# Patient Record
Sex: Female | Born: 1980 | Race: Black or African American | Hispanic: No | Smoking: Never smoker
Health system: Southern US, Community
[De-identification: ages and names within clinical notes are randomized; demographics above are authoritative.]

---
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
## 2000-06-10 ENCOUNTER — Encounter (INDEPENDENT_AMBULATORY_CARE_PROVIDER_SITE_OTHER): Payer: Self-pay | Admitting: Specialist

## 2000-06-10 ENCOUNTER — Other Ambulatory Visit: Admission: RE | Admit: 2000-06-10 | Discharge: 2000-06-10 | Payer: Self-pay | Admitting: Obstetrics and Gynecology

## 2000-11-24 ENCOUNTER — Inpatient Hospital Stay (HOSPITAL_COMMUNITY): Admission: AD | Admit: 2000-11-24 | Discharge: 2000-11-24 | Payer: Self-pay | Admitting: Obstetrics and Gynecology

## 2000-11-25 ENCOUNTER — Inpatient Hospital Stay (HOSPITAL_COMMUNITY): Admission: AD | Admit: 2000-11-25 | Discharge: 2000-11-25 | Payer: Self-pay | Admitting: Obstetrics and Gynecology

## 2000-12-04 ENCOUNTER — Other Ambulatory Visit: Admission: RE | Admit: 2000-12-04 | Discharge: 2000-12-04 | Payer: Self-pay | Admitting: Obstetrics and Gynecology

## 2000-12-24 ENCOUNTER — Inpatient Hospital Stay (HOSPITAL_COMMUNITY): Admission: AD | Admit: 2000-12-24 | Discharge: 2000-12-27 | Payer: Self-pay | Admitting: Obstetrics and Gynecology

## 2001-01-22 ENCOUNTER — Other Ambulatory Visit: Admission: RE | Admit: 2001-01-22 | Discharge: 2001-01-22 | Payer: Self-pay | Admitting: Obstetrics and Gynecology

## 2002-06-18 ENCOUNTER — Other Ambulatory Visit: Admission: RE | Admit: 2002-06-18 | Discharge: 2002-06-18 | Payer: Self-pay | Admitting: Obstetrics and Gynecology

## 2002-11-12 ENCOUNTER — Inpatient Hospital Stay (HOSPITAL_COMMUNITY): Admission: AD | Admit: 2002-11-12 | Discharge: 2002-11-12 | Payer: Self-pay | Admitting: Obstetrics and Gynecology

## 2002-11-16 ENCOUNTER — Inpatient Hospital Stay (HOSPITAL_COMMUNITY): Admission: AD | Admit: 2002-11-16 | Discharge: 2002-11-16 | Payer: Self-pay | Admitting: Obstetrics and Gynecology

## 2002-11-20 ENCOUNTER — Inpatient Hospital Stay (HOSPITAL_COMMUNITY): Admission: AD | Admit: 2002-11-20 | Discharge: 2002-11-21 | Payer: Self-pay | Admitting: Obstetrics and Gynecology

## 2004-02-17 ENCOUNTER — Emergency Department (HOSPITAL_COMMUNITY): Admission: EM | Admit: 2004-02-17 | Discharge: 2004-02-17 | Payer: Self-pay | Admitting: Emergency Medicine

## 2004-03-27 ENCOUNTER — Emergency Department (HOSPITAL_COMMUNITY): Admission: EM | Admit: 2004-03-27 | Discharge: 2004-03-27 | Payer: Self-pay | Admitting: Emergency Medicine

## 2004-10-27 ENCOUNTER — Emergency Department (HOSPITAL_COMMUNITY): Admission: EM | Admit: 2004-10-27 | Discharge: 2004-10-27 | Payer: Self-pay | Admitting: Family Medicine

## 2005-02-23 ENCOUNTER — Other Ambulatory Visit: Admission: RE | Admit: 2005-02-23 | Discharge: 2005-02-23 | Payer: Self-pay | Admitting: Obstetrics and Gynecology

## 2006-04-09 ENCOUNTER — Other Ambulatory Visit: Admission: RE | Admit: 2006-04-09 | Discharge: 2006-04-09 | Payer: Self-pay | Admitting: Obstetrics and Gynecology

## 2008-08-27 ENCOUNTER — Ambulatory Visit (HOSPITAL_COMMUNITY): Admission: RE | Admit: 2008-08-27 | Discharge: 2008-08-27 | Payer: Self-pay | Admitting: Obstetrics and Gynecology

## 2010-02-23 ENCOUNTER — Encounter: Admission: RE | Admit: 2010-02-23 | Discharge: 2010-02-23 | Payer: Self-pay | Admitting: Internal Medicine

## 2010-03-23 ENCOUNTER — Emergency Department (HOSPITAL_BASED_OUTPATIENT_CLINIC_OR_DEPARTMENT_OTHER): Admission: EM | Admit: 2010-03-23 | Discharge: 2010-03-23 | Payer: Self-pay | Admitting: Emergency Medicine

## 2010-12-22 NOTE — H&P (Signed)
NAME:  Janice Hall, Janice Hall                        ACCOUNT NO.:  [id number]   MEDICAL RECORD NO.:  [id number]                   PATIENT TYPE:  INP   LOCATION:  9165                                 FACILITY:  [facility]   PHYSICIAN:  Crist Fat. Rivard, M.D.              DATE OF BIRTH:  10-Dec-1980   DATE OF ADMISSION:  11/20/2002  DATE OF DISCHARGE:                                HISTORY & PHYSICAL   HISTORY OF PRESENT ILLNESS:  The patient is a 30 year old single black  female, gravida 4, para 2-0-1-2, at 38-3/7 weeks who presents complaining of  uterine contractions every three to five minutes for the last couple of  hours.  She denies any leaking or vaginal bleeding.  She reports positive  fetal movement.  She reports that she was 3 to 4 cm in the office earlier  today.  She denies any nausea, vomiting, headaches, or visual disturbances.  Her pregnancy has been followed at [facility] by the M.D.  service, and has been essentially uncomplicated, though she has been at risk  for presenting late for care and a history of preterm labor with her first  pregnancy.  Group B Strep is negative.   PAST OB/GYN HISTORY:  1. She is a gravida 4, para 2-0-1-2, delivered a viable female infant in     2000, that weighed 6 pounds 13 ounces at [redacted] weeks gestation following a     10 hour labor.  The infant's name is Mya.  2. In 2002, she delivered a viable female infant that weighed 6 pounds 12     ounces at [redacted] weeks gestation following a five hour labor.  She did have     an epidural with that labor.  That infant's name is Apolinar Junes.  3. Later in 2002, she had an elective AB with no complications.  4. She has this current pregnancy.  Other GYN history is noncontributory.   ALLERGIES:  No known drug allergies.   PAST MEDICAL HISTORY:  She reports having had the usual childhood diseases.  She has no medical problems.   PAST SURGICAL HISTORY:  Her only hospitalization has been for childbirth.   FAMILY HISTORY:  Significant only for a paternal uncle with myocardial  infarction and heart disease.   GENETIC HISTORY:  Negative.   SOCIAL HISTORY:  She is single.  The father of the baby is Caleb Popp.  He is supportive.  She is of [place] faith.  She denies any illicit drug  use, alcohol, or smoking with this pregnancy.   LABORATORY DATA:  Blood type is O positive, antibody screen is negative.  Sickle cell trait is negative.  Syphilis is nonreactive.  Rubella is immune.  Hepatitis B surface antigen is negative.  HIV is nonreactive.  Cystic  fibrosis is negative.  GC and Chlamydia were negative.  Pap was within  normal limits.  Her one hour Glucola was 64.  Her maternal serum alpha  fetoprotein was within normal range.  She had an ultrasound at [redacted] weeks  gestation showing appropriate growth at 25th to 50th percentile, and normal  fluid.  Her Group B Strep at 36 weeks was negative, as well as her gonorrhea  and Chlamydia.   PHYSICAL EXAMINATION:  VITAL SIGNS:  Stable.  She is afebrile.  HEENT:  Grossly within normal limits.  HEART:  Regular rate and rhythm.  CHEST:  Clear.  BREASTS:  Soft and nontender.  ABDOMEN:  Gravid with uterine contractions every three to four minutes.  Fetal heart rate is reactive and reassuring.  PELVIC:  Her cervix is 6 cm, 90%, vertex, -1 with intact membranes.  EXTREMITIES:  Within normal limits.   ASSESSMENT:  1. Intrauterine pregnancy at term.  2. Active labor.  3. Negative Group B Strep.   PLAN:  Admit to labor and delivery, follow routine M.D. orders, and Dr.  Estanislado Pandy has been notified of the patient's admission and will follow the  patient.      Concha Pyo. Duplantis, C.N.M.              Crist Fat Rivard, M.D.    SJD/MEDQ  D:  11/20/2002  T:  11/20/2002  Job:  [id number]

## 2010-12-22 NOTE — H&P (Signed)
[facility]  Patient:    Janice Hall, Janice Hall                     MRN: [id number] Adm. Date:  [id number] Disc. Date: [id number] Attending:  Silverio Lay A                         History and Physical  REASON FOR ADMISSION:         Intrauterine pregnancy at 36 weeks and 3 days and spontaneous labor.  HISTORY OF PRESENT ILLNESS:   This is a 30 year old single African-American woman, gravida 2, para 1, with an estimated due date by ultrasound of January 18, 2001, being admitted at 36 weeks and 4 days for spontaneous labor. She started feeling regular uterine contractions every five minutes about 8 p.m. on Dec 24, 2000.  She arrived at [facility] at 11 oclock with contractions every 2 to 3 minutes and a vaginal exam of 4 to 5 cm, 80% effaced, vertex -1.  At 11:48 p.m., she spontaneously ruptured her membranes with clear fluid.  She reports good fetal activity, denies any symptoms of pregnancy-induced hypertension and fetal heart rate tracings have remained reactive.  Prenatal course reveals blood type O-positive, sickle cell trait negative, RPR nonreactive, rubella immune, HBsAg negative, HIV nonreactive, Pap smear normal, gonorrhea negative, Chlamydia negative, ultrasound at 20 weeks revealed a posterior partial previa with a normal anatomy survey otherwise, AFP was too late for interpretation, 24-week ultrasound revealed a normal posterior placenta and estimated fetal weight in the 37th percentile, 28-week glucose tolerance test was within normal limits and 35-week group B strep was positive.  Prenatal course was otherwise uneventful.  ALLERGIES:                    No known drug allergies.  PAST MEDICAL HISTORY:         September 2000, spontaneous vaginal delivery of a little girl at 40 weeks weighing 6 pounds 12 ounces, no complications. History of CIN-3, November 2001, pending treatment.  FAMILY HISTORY:               Unremarkable.  SOCIAL  HISTORY:               Single, nonsmoker, supportive father of the baby.  Patient is a Consulting civil engineer.  PHYSICAL EXAMINATION:  VITAL SIGNS:                  Normal.  HEENT:                        Negative.  LUNGS:                        Clear.  HEART:                        Normal.  ABDOMEN:                      Gravid, nontender.  PELVIC:                       Vaginal exam upon admission by admitting nurse, 4 to 5 cm, 80% effaced, vertex -1 station.  EXTREMITIES:                  Negative.  FETAL HEART RATE TRACING:  Reassuring.  ASSESSMENT:                   Intrauterine pregnancy at 36 weeks and 4 days, in spontaneous labor with group B streptococcus positive.  PLAN:                         Start penicillin per protocol, spontaneous vaginal delivery expected, may have epidural as needed. DD:  12/25/00 TD:  12/25/00 Job: [id number]

## 2011-01-29 ENCOUNTER — Emergency Department (HOSPITAL_BASED_OUTPATIENT_CLINIC_OR_DEPARTMENT_OTHER)
Admission: EM | Admit: 2011-01-29 | Discharge: 2011-01-29 | Disposition: A | Discharge status: Home / Self Care | Payer: BC Managed Care – PPO | Attending: Emergency Medicine | Admitting: Emergency Medicine

## 2011-01-29 DIAGNOSIS — J329 Chronic sinusitis, unspecified: Secondary | ICD-10-CM | POA: Insufficient documentation

## 2011-02-08 ENCOUNTER — Emergency Department (HOSPITAL_BASED_OUTPATIENT_CLINIC_OR_DEPARTMENT_OTHER)
Admission: EM | Admit: 2011-02-08 | Discharge: 2011-02-08 | Disposition: A | Discharge status: Home / Self Care | Payer: BC Managed Care – PPO | Attending: Emergency Medicine | Admitting: Emergency Medicine

## 2011-02-08 DIAGNOSIS — J029 Acute pharyngitis, unspecified: Secondary | ICD-10-CM | POA: Insufficient documentation

## 2014-04-10 ENCOUNTER — Encounter (HOSPITAL_BASED_OUTPATIENT_CLINIC_OR_DEPARTMENT_OTHER): Payer: Self-pay | Admitting: Emergency Medicine

## 2014-04-10 ENCOUNTER — Emergency Department (HOSPITAL_BASED_OUTPATIENT_CLINIC_OR_DEPARTMENT_OTHER): Payer: BC Managed Care – PPO

## 2014-04-10 ENCOUNTER — Emergency Department (HOSPITAL_BASED_OUTPATIENT_CLINIC_OR_DEPARTMENT_OTHER)
Admission: EM | Admit: 2014-04-10 | Discharge: 2014-04-10 | Disposition: A | Discharge status: Home / Self Care | Payer: BC Managed Care – PPO | Attending: Emergency Medicine | Admitting: Emergency Medicine

## 2014-04-10 DIAGNOSIS — S0190XA Unspecified open wound of unspecified part of head, initial encounter: Secondary | ICD-10-CM | POA: Insufficient documentation

## 2014-04-10 DIAGNOSIS — S0003XA Contusion of scalp, initial encounter: Secondary | ICD-10-CM | POA: Insufficient documentation

## 2014-04-10 DIAGNOSIS — Y9389 Activity, other specified: Secondary | ICD-10-CM | POA: Insufficient documentation

## 2014-04-10 DIAGNOSIS — Z79899 Other long term (current) drug therapy: Secondary | ICD-10-CM | POA: Insufficient documentation

## 2014-04-10 DIAGNOSIS — Y929 Unspecified place or not applicable: Secondary | ICD-10-CM | POA: Diagnosis not present

## 2014-04-10 DIAGNOSIS — W208XXA Other cause of strike by thrown, projected or falling object, initial encounter: Secondary | ICD-10-CM | POA: Insufficient documentation

## 2014-04-10 DIAGNOSIS — Z792 Long term (current) use of antibiotics: Secondary | ICD-10-CM | POA: Insufficient documentation

## 2014-04-10 DIAGNOSIS — S1093XA Contusion of unspecified part of neck, initial encounter: Secondary | ICD-10-CM | POA: Diagnosis not present

## 2014-04-10 DIAGNOSIS — T148XXA Other injury of unspecified body region, initial encounter: Secondary | ICD-10-CM

## 2014-04-10 DIAGNOSIS — S0990XA Unspecified injury of head, initial encounter: Secondary | ICD-10-CM | POA: Insufficient documentation

## 2014-04-10 DIAGNOSIS — L089 Local infection of the skin and subcutaneous tissue, unspecified: Secondary | ICD-10-CM

## 2014-04-10 DIAGNOSIS — S0083XA Contusion of other part of head, initial encounter: Secondary | ICD-10-CM | POA: Insufficient documentation

## 2014-04-10 DIAGNOSIS — S0093XA Contusion of unspecified part of head, initial encounter: Secondary | ICD-10-CM

## 2014-04-10 MED ORDER — CEPHALEXIN 500 MG PO CAPS: 500.0000 mg | ORAL_CAPSULE | Freq: Four times a day (QID) | ORAL | Status: AC

## 2014-04-10 MED ORDER — FLUCONAZOLE 200 MG PO TABS: 200.0000 mg | ORAL_TABLET | Freq: Once | ORAL | Status: AC

## 2014-04-10 MED ORDER — TETANUS-DIPHTH-ACELL PERTUSSIS 5-2.5-18.5 LF-MCG/0.5 IM SUSP
0.5000 mL | Freq: Once | INTRAMUSCULAR | Status: AC
  Administered 2014-04-10: 0.5 mL via INTRAMUSCULAR
  Filled 2014-04-10: qty 0.5

## 2014-04-10 MED ORDER — ACETAMINOPHEN 325 MG PO TABS
650.0000 mg | ORAL_TABLET | Freq: Once | ORAL | Status: AC
  Administered 2014-04-10: 650 mg via ORAL
  Filled 2014-04-10: qty 2

## 2014-04-10 MED ORDER — HYDROCODONE-ACETAMINOPHEN 5-325 MG PO TABS: ORAL_TABLET | ORAL | Status: AC

## 2014-04-10 MED ORDER — CEPHALEXIN 250 MG PO CAPS
500.0000 mg | ORAL_CAPSULE | Freq: Once | ORAL | Status: AC
  Administered 2014-04-10: 500 mg via ORAL
  Filled 2014-04-10: qty 2

## 2014-04-10 NOTE — ED Provider Notes (Signed)
Medical screening examination/treatment/procedure(s) were performed by non-physician practitioner and as supervising physician I was immediately available for consultation/collaboration.   EKG Interpretation None       Doug Sou, MD 04/10/14 1544

## 2014-04-10 NOTE — ED Notes (Addendum)
Patient here with ongoing right sided head pain that she reports started after mirror from dresser fell on same 2 weeks ago, reports scalp tender to touch, no loc at time of event. No dizziness or any associated symptoms.

## 2014-04-10 NOTE — ED Provider Notes (Signed)
CSN: [id number]     Arrival date & time 04/10/14  1234 History   First MD Initiated Contact with Patient 04/10/14 1252     Chief Complaint  Patient presents with  . Head pain      (Consider location/radiation/quality/duration/timing/severity/associated sxs/prior Treatment) HPI  Janice Hall is a 33 y.o. female complaining of 8/10 right  periorbital pain onset last night. Patient states that she had a near fall on her head approximately 2 weeks ago, there was no shattering of the glass. Pain has been minimal but scalp is tender to palpation. Patient denies any fever, chills, discharge but she says that her lesions on her scalp. She describes the pain as a headache and states that she has nasal congestion as well. Headache is atypical for her, she's not taking any blood thinners. Denies cervicalgia, LOC/syncope, change in vision, N/V, numbness, weakness, dysarthria, ataxia. States last tetanus was in 2004.    History reviewed. No pertinent past medical history. History reviewed. No pertinent past surgical history. No family history on file. History  Substance Use Topics  . Smoking status: Never Smoker   . Smokeless tobacco: Not on file  . Alcohol Use: Not on file   OB History   Grav Para Term Preterm Abortions TAB SAB Ect Mult Living                 Review of Systems  10 systems reviewed and found to be negative, except as noted in the HPI.   Allergies  Review of patient's allergies indicates no known allergies.  Home Medications   Prior to Admission medications   Medication Sig Start Date End Date Taking? Authorizing Provider  Multiple Vitamin (MULTIVITAMIN) tablet Take 1 tablet by mouth daily.   Yes Historical Provider, MD  norethindrone-ethinyl estradiol-iron (MICROGESTIN FE,GILDESS FE,LOESTRIN FE) 1.5-30 MG-MCG tablet Take 1 tablet by mouth daily.   Yes Historical Provider, MD  cephALEXin (KEFLEX) 500 MG capsule Take 1 capsule (500 mg total) by mouth 4 (four) times  daily. 04/10/14   Margherita Collyer, PA-C  HYDROcodone-acetaminophen (NORCO/VICODIN) 5-325 MG per tablet Take 1-2 tablets by mouth every 6 hours as needed for pain. 04/10/14   Cicilia Clinger, PA-C   BP 117/63  Pulse 55  Temp(Src) 98.2 F (36.8 C) (Oral)  Resp 18  SpO2 100%  LMP 04/09/2014 Physical Exam  Nursing note and vitals reviewed. Constitutional: She is oriented to person, place, and time. She appears well-developed and well-nourished. No distress.  HENT:  Head: Normocephalic.  Mouth/Throat: Oropharynx is clear and moist.  No drooling or stridor. Posterior pharynx mildly erythematous no significant tonsillar hypertrophy. No exudate. Soft palate rises symmetrically. No TTP or induration under tongue.   No tenderness to palpation of frontal or bilateral maxillary sinuses.  No mucosal edema in the nares.  Bilateral tympanic membranes with normal architecture and good light reflex.    Eyes: Conjunctivae and EOM are normal. Pupils are equal, round, and reactive to light.  Neck: Normal range of motion.  No midline C-spine  tenderness to palpation or step-offs appreciated. Patient has full range of motion without pain.   Cardiovascular: Normal rate, regular rhythm and intact distal pulses.   Pulmonary/Chest: Effort normal and breath sounds normal. No stridor.  Abdominal: Soft.  Musculoskeletal: Normal range of motion.  Neurological: She is alert and oriented to person, place, and time.  II-Visual fields grossly intact. III/IV/VI-Extraocular movements intact.  Pupils reactive bilaterally. V/VII-Smile symmetric, equal eyebrow raise,  facial sensation intact VIII- Hearing grossly  intact IX/X-Normal gag XI-bilateral shoulder shrug XII-midline tongue extension Motor: 5/5 bilaterally with normal tone and bulk Cerebellar: Normal finger-to-nose  and normal heel-to-shin test.   Romberg negative Ambulates with a coordinated gait  Skin: Skin is warm.  Patient has partial-thickness  abrasions to scalp in the right temporal area, there is a honey colored crusted discharge.  Psychiatric: She has a normal mood and affect.    ED Course  Procedures (including critical care time) Labs Review Labs Reviewed - No data to display  Imaging Review Ct Head Wo Contrast  04/10/2014   CLINICAL DATA:  Me her fell on head. Persistent right frontal headache.  EXAM: CT HEAD WITHOUT CONTRAST  TECHNIQUE: Contiguous axial images were obtained from the base of the skull through the vertex without intravenous contrast.  COMPARISON:  08/31/2011  FINDINGS: The brainstem, cerebellum, cerebral peduncles, thalamus, basal ganglia, basilar cisterns, and ventricular system appear within normal limits. No intracranial hemorrhage, mass lesion, or acute CVA. No significant scalp hematoma.  IMPRESSION: 1.  No significant abnormality identified.   Electronically Signed   By: Herbie Baltimore M.D.   On: 04/10/2014 13:16     EKG Interpretation None      MDM   Final diagnoses:  Infected laceration  Head contusion, initial encounter   Filed Vitals:   04/10/14 1242  BP: 117/63  Pulse: 55  Temp: 98.2 F (36.8 C)  TempSrc: Oral  Resp: 18  SpO2: 100%    Medications  Tdap (BOOSTRIX) injection 0.5 mL (0.5 mLs Intramuscular Given 04/10/14 1317)  acetaminophen (TYLENOL) tablet 650 mg (650 mg Oral Given 04/10/14 1316)  cephALEXin (KEFLEX) capsule 500 mg (500 mg Oral Given 04/10/14 1316)    Janice Hall is a 33 y.o. female presenting with severe right-sided periorbital headache. Patient had head trauma 2 weeks ago. Neuro exam is nonfocal. There is a defect abrasion to her right scalp. Physical exam is not consistent with sinusitis. Head CT ordered out of an abundance of caution is negative. Patient will be treated for soft tissue infection with Keflex and given Vicodin for pain control. Tetanus is updated.  Evaluation does not show pathology that would require ongoing emergent intervention or inpatient  treatment. Pt is hemodynamically stable and mentating appropriately. Discussed findings and plan with patient/guardian, who agrees with care plan. All questions answered. Return precautions discussed and outpatient follow up given.   New Prescriptions   CEPHALEXIN (KEFLEX) 500 MG CAPSULE    Take 1 capsule (500 mg total) by mouth 4 (four) times daily.   HYDROCODONE-ACETAMINOPHEN (NORCO/VICODIN) 5-325 MG PER TABLET    Take 1-2 tablets by mouth every 6 hours as needed for pain.         Wynetta Emery, PA-C 04/10/14 1342

## 2014-04-10 NOTE — Discharge Instructions (Signed)
Take vicodin for breakthrough pain, do not drink alcohol, drive, care for children or do other critical tasks while taking vicodin.  If you see signs of infection (warmth, redness, tenderness, pus, sharp increase in pain, fever, red streaking) immediately return to the emergency department.  Take your antibiotics as directed and to completion. You should never have any leftover antibiotics! Push fluids and stay well hydrated.   Do not hesitate to return to the emergency room for any new, worsening or concerning symptoms.  Please obtain primary care using resource guide below. But the minute you were seen in the emergency room and that they will need to obtain records for further outpatient management.  Head Injury You have received a head injury. It does not appear serious at this time. Headaches and vomiting are common following head injury. It should be easy to awaken from sleeping. Sometimes it is necessary for you to stay in the emergency department for a while for observation. Sometimes admission to the hospital may be needed. After injuries such as yours, most problems occur within the first 24 hours, but side effects may occur up to 7-10 days after the injury. It is important for you to carefully monitor your condition and contact your health care provider or seek immediate medical care if there is a change in your condition. WHAT ARE THE TYPES OF HEAD INJURIES? Head injuries can be as minor as a bump. Some head injuries can be more severe. More severe head injuries include:  A jarring injury to the brain (concussion).  A bruise of the brain (contusion). This mean there is bleeding in the brain that can cause swelling.  A cracked skull (skull fracture).  Bleeding in the brain that collects, clots, and forms a bump (hematoma). WHAT CAUSES A HEAD INJURY? A serious head injury is most likely to happen to someone who is in a car wreck and is not wearing a seat belt. Other causes of major  head injuries include bicycle or motorcycle accidents, sports injuries, and falls. HOW ARE HEAD INJURIES DIAGNOSED? A complete history of the event leading to the injury and your current symptoms will be helpful in diagnosing head injuries. Many times, pictures of the brain, such as CT or MRI are needed to see the extent of the injury. Often, an overnight hospital stay is necessary for observation.  WHEN SHOULD I SEEK IMMEDIATE MEDICAL CARE?  You should get help right away if:  You have confusion or drowsiness.  You feel sick to your stomach (nauseous) or have continued, forceful vomiting.  You have dizziness or unsteadiness that is getting worse.  You have severe, continued headaches not relieved by medicine. Only take over-the-counter or prescription medicines for pain, fever, or discomfort as directed by your health care provider.  You do not have normal function of the arms or legs or are unable to walk.  You notice changes in the black spots in the center of the colored part of your eye (pupil).  You have a clear or bloody fluid coming from your nose or ears.  You have a loss of vision. During the next 24 hours after the injury, you must stay with someone who can watch you for the warning signs. This person should contact local emergency services (911 in the U.S.) if you have seizures, you become unconscious, or you are unable to wake up. HOW CAN I PREVENT A HEAD INJURY IN THE FUTURE? The most important factor for preventing major head injuries is avoiding motor  vehicle accidents. To minimize the potential for damage to your head, it is crucial to wear seat belts while riding in motor vehicles. Wearing helmets while bike riding and playing collision sports (like football) is also helpful. Also, avoiding dangerous activities around the house will further help reduce your risk of head injury.  WHEN CAN I RETURN TO NORMAL ACTIVITIES AND ATHLETICS? You should be reevaluated by your health  care provider before returning to these activities. If you have any of the following symptoms, you should not return to activities or contact sports until 1 week after the symptoms have stopped:  Persistent headache.  Dizziness or vertigo.  Poor attention and concentration.  Confusion.  Memory problems.  Nausea or vomiting.  Fatigue or tire easily.  Irritability.  Intolerant of bright lights or loud noises.  Anxiety or depression.  Disturbed sleep. MAKE SURE YOU:   Understand these instructions.  Will watch your condition.  Will get help right away if you are not doing well or get worse. Document Released: 07/23/2005 Document Revised: 07/28/2013 Document Reviewed: 03/30/2013 [facility] Patient Information 2015 [place], [place]. This information is not intended to replace advice given to you by your health care provider. Make sure you discuss any questions you have with your health care provider.    Emergency Department Resource Guide 1) Find a Doctor and Pay Out of Pocket Although you won't have to find out who is covered by your insurance plan, it is a good idea to ask around and get recommendations. You will then need to call the office and see if the doctor you have chosen will accept you as a new patient and what types of options they offer for patients who are self-pay. Some doctors offer discounts or will set up payment plans for their patients who do not have insurance, but you will need to ask so you aren't surprised when you get to your appointment.  2) Contact Your Local Health Department Not all health departments have doctors that can see patients for sick visits, but many do, so it is worth a call to see if yours does. If you don't know where your local health department is, you can check in your phone book. The CDC also has a tool to help you locate your state's health department, and many state websites also have listings of all of their local health  departments.  3) Find a Walk-in Clinic If your illness is not likely to be very severe or complicated, you may want to try a walk in clinic. These are popping up all over the country in pharmacies, drugstores, and shopping centers. They're usually staffed by nurse practitioners or physician assistants that have been trained to treat common illnesses and complaints. They're usually fairly quick and inexpensive. However, if you have serious medical issues or chronic medical problems, these are probably not your best option.  No Primary Care Doctor: - Call Health Connect at  [phone] - they can help you locate a primary care doctor that  accepts your insurance, provides certain services, etc. - Physician Referral Service- [phone]  Chronic Pain Problems: Organization         Address  Phone   Notes  [facility]  [phone] Patients need to be referred by their primary care doctor.   Medication Assistance: Organization         Address  Phone   Notes  [facility] Medication [facility] [address] [place] [place], [place] [address] [phone] --Must be a resident of [facility] -- Must have NO insurance coverage whatsoever (no Medicaid/ Medicare, etc.) -- The pt. MUST have a primary care doctor that directs their care regularly and follows them in the community   [facility]  [phone]   [employer]  [phone]    Agencies that provide inexpensive medical care: Organization         Address  Phone   Notes  [facility]  [phone]   [facility]    [phone]   [facility] [address] [place], [place] [address] [phone]   [facility] [address] [place], [place] [phone]   Planned Parenthood    [phone]   [facility]    [phone]   [facility]  [address], [place] Phone:  [phone], Fax:  [phone] Hours of Operation:  9 am - 6 pm, M-F.  Also accepts Medicaid/Medicare and self-pay.  [facility]  [address], [place] Phone: [phone], Fax: [phone]. Hours of Operation:  8:30 am - 5:30 pm, M-F.  Also accepts Medicaid and self-pay.  [facility] [place] [address] [place] Phone: [phone]   [facility] [address] Natasha Bence [place], [place] [phone], Ext. [phone] Mondays & Thursdays: 7-9 AM.  First 15 patients are seen on a first come, first serve basis.    Medicaid-accepting [facility] Providers:  Organization         Address  Phone   Notes  [facility] [address], [place] [phone] Also accepts self-pay patients.  [facility] [address] Laurell Josephs [place], [place]  [phone]   [facility] [address], [place] [phone]   [facility] [address] [place] [phone]   Renaye Rakers [address], [place]   [phone] Only accepts [place] Access [place] patients after they have their name applied to their card.   Self-Pay (no insurance) in [facility]  Organization         Address  Phone   Notes  Sickle Cell Patients, [facility] Medicine [address] [place], [place] [phone]   [facility] [address] [place], [place] [phone]   [facility] [place]  [address], [place] [phone]   [facility]/Dr. Osei-Bonsu  [address] [place] or [address], [place] [phone] Phone number for both [place] and [place] locations is the same.  Urgent Medical and [facility] [address], [place] [phone]   [facility] [address] [place] or [address] [phone]   [facility] [address] [place] [phone], phone; [phone], fax Sees patients 1st and 3rd Saturday of every month.  Must not qualify for public or private insurance (i.e. Medicaid, Medicare, [place] Health Choice, Veterans' Benefits)  Household income should be no more than 200% of the poverty level The clinic cannot treat you if you are pregnant or think you are pregnant  Sexually transmitted diseases are not treated at the clinic.    Dental Care: Organization  Address  Phone  Notes  [facility] [place], [place] [phone] Accepts children up to age 78 who are enrolled in [place] or [place]; pregnant women with a Medicaid card; and children who have applied for Medicaid or [place] Health Choice, but were declined, whose parents can pay a reduced fee at time of service.  [facility] of [facility]  [address], [place] [phone] Accepts children up to age 27 who are enrolled in [place] or [place]; pregnant women with a Medicaid card; and children who have applied for Medicaid or [place] Health Choice, but were declined, whose parents can pay a reduced fee at time of service.  [place] Adult [facility] Access PROGRAM  [place] [phone] Patients are seen by appointment only. Walk-ins are not accepted. [place] will see patients 41 years of age and older. Monday - Tuesday (8am-5pm) Most Wednesdays (8:30-5pm) $30 per visit, cash only  [facility] Adult [facility] Access PRO[facility]  [address], [facility] [phone] Patients are seen by appointment only. Walk-ins are not accepted. [place] will see patients 58 years of age and older. One Wednesday Evening (Monthly: Volunteer Based).  $30 per visit, cash only  [place]  [phone] for adults;  Children under age 3, call Graduate Pediatric Dentistry at [phone]. Children aged 20-14, please call [phone] to request a pediatric application.  Dental services are provided in all areas of dental care including fillings, crowns and bridges, complete and partial dentures, implants, gum treatment, root canals, and extractions. Preventive care is also provided. Treatment is provided to both adults and children. Patients are selected via a lottery and there is often a waiting list.   [facility] [address], [place]  [phone] [email or web address]   [facility] [address] [place], [place] [phone], Ext. [phone] Second and Fourth Thursday of each month, opens at 6:30 AM; Clinic ends at 9 AM.  Patients are seen on a first-come first-served basis, and a limited number are seen during each clinic.   [facility]  [address] Hillard Danker [place], [place] [phone]   Eligibility Requirements You must have lived in [place], [place], or [place] counties for at least the last three months.   You cannot be eligible for state or federal sponsored [employer], including [employer], [place], or [employer].   You generally cannot be eligible for healthcare insurance through your employer.    How to apply: Eligibility screenings are held every Tuesday and Wednesday afternoon from 1:00 pm until 4:00 pm. You do not need an appointment for the interview!  [facility] [address] [place], [place]   [place]  [place] Department  Lupus  [phone]    Behavioral Health Resources in the Community: Intensive Outpatient Programs Organization         Address  Phone  Notes  [facility]. [address] [place], [place] [phone]   [facility] [address], [place], [place]   ADS: [facility] & Drug Svcs [address], [place], [place]   [place] [address]  [place], [place] or [phone]   Substance Abuse Resources  Organization         Address  Phone  Notes  [facility] Drug Services  [phone]   [facility]  [phone]   [facility]  [phone]   [facility]  [phone]   Residential & Outpatient Substance Abuse Program  [phone]   Psychological Services Organization         Address  Phone  Notes  [facility]  [phone]- [phone]   [facility]  [phone]- [phone]   [facility] [address], [place] [phone] or [phone]    Mobile Crisis Teams Organization         Address  Phone  Notes  [facility], Mobile Crisis Care Unit  [phone]   [facility]  [address] [place], [place] [phone]   Doristine LocksSharon DeEsch [address] [place] [phone]    Self-Help/Support Groups Organization         Address  Phone             Notes  [facility] [place] - variety of support groups  336- [phone] Call for more information  Narcotics Anonymous (NA), [facility] [address], [place]  2 meetings at this location   Statisticianesidential Treatment Programs Organization         Address  Phone  Notes  [facility] [address],    [place]  [phone]   [facility]  [address], [place] [phone], [place], [place] [phone]   [facility] [address], [place] [phone] Admissions: 8am-3pm M-F  [facility] [address]    [place], [place] [phone]   [facility] [address], [place], [place] [phone]   [facility] [address]  [place], [place] [phone]   [facility] - Intensive  Outpatient [address], [place], [place] [phone]   [facility] ([facility] [address]  [place], [place] [phone] or [phone]   [facility] ([facility]) [address] [place], [place] [phone] Accepts Medicaid  [facility] [address]  [place] [phone] Substance Abuse/Addiction Treatment   [facility] Resources Organization         Address  Phone  Notes  [facility]  [phone]) [phone]   Angie FavaJulie Brannon, PhD [address] [place], [place]   [phone](336) [phone] or [phone](336) [phone]   [facility] [place]   [address] [place], [place] [phone](336) [phone]   [facility] [address], [place], [place] [phone] Insurance/Medicaid/sponsorship through [facility]  [facility] [address]                                    [place], [place] [phone] Therapy/tele-psych/case  [facility] [address]   [place], [place] [phone]) [phone]    Dr. Lolly MustacheArfeen  [phone]   [facility]  [employer] [facility] 1) [address], [place] 2) [address], [place] 3)  [address], [place] [phone]) [phone]  [phone](336) [phone]   [facility] Child Abuse Hotline [phone]) [phone] or [phone] (After Hours)

## 2021-04-26 ENCOUNTER — Ambulatory Visit: Payer: Self-pay

## 2021-04-26 ENCOUNTER — Other Ambulatory Visit: Payer: Self-pay | Admitting: Family Medicine

## 2021-04-26 ENCOUNTER — Other Ambulatory Visit: Payer: Self-pay

## 2021-04-26 DIAGNOSIS — M79645 Pain in left finger(s): Secondary | ICD-10-CM

## 2023-01-24 IMAGING — DX DG FINGER THUMB 2+V*L*
3 series · 3 of 3 positions shown · non-contrast
Comparison: None.

CLINICAL DATA: Left thumb pain.

EXAM:
LEFT THUMB 2+V

[finger pa]
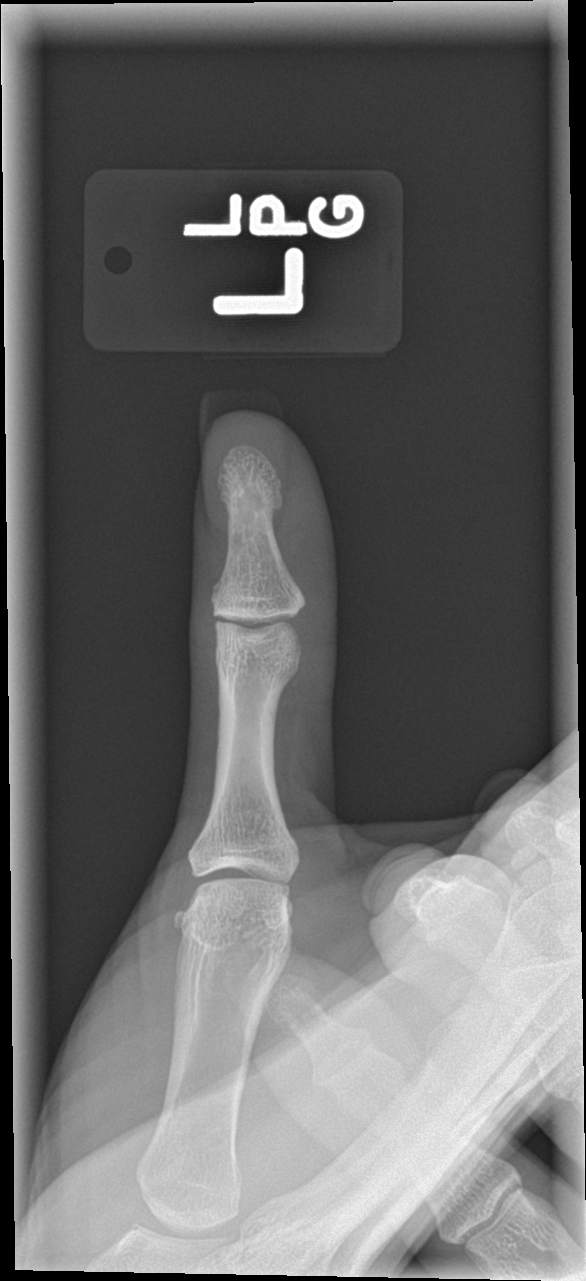

[finger obl]
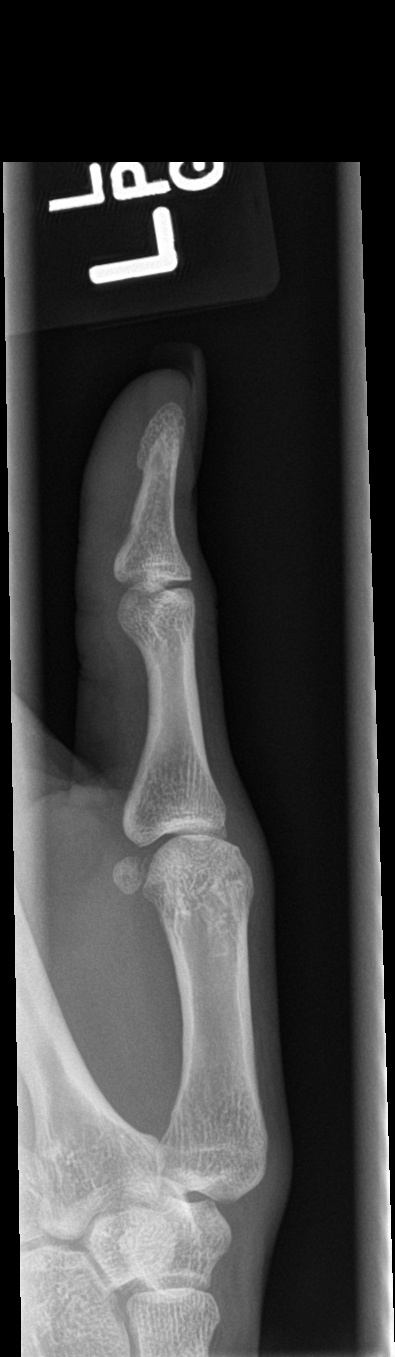

[finger lat]
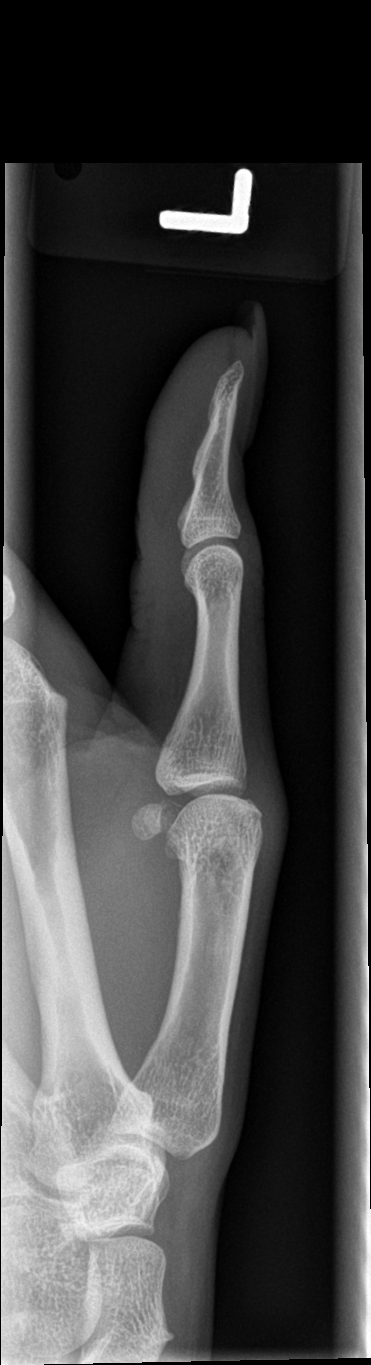

[3 of 3 positions shown; findings below may reference images not displayed]

FINDINGS: There is no evidence of fracture or dislocation. There is no
evidence of arthropathy or other focal bone abnormality. Soft
tissues are unremarkable.
IMPRESSION: Negative.

## 2024-04-10 ENCOUNTER — Other Ambulatory Visit: Payer: Self-pay | Admitting: Medical Genetics

## 2024-08-07 ENCOUNTER — Other Ambulatory Visit: Payer: Self-pay | Admitting: Medical Genetics

## 2024-08-07 DIAGNOSIS — Z006 Encounter for examination for normal comparison and control in clinical research program: Secondary | ICD-10-CM

## 2024-08-30 LAB — GENECONNECT MOLECULAR SCREEN: Genetic Analysis Overall Interpretation: NEGATIVE
